# Patient Record
Sex: Female | Born: 1960
Health system: Southern US, Community
[De-identification: ages and names within clinical notes are randomized; demographics above are authoritative.]

## PROBLEM LIST (undated history)

## (undated) DIAGNOSIS — R011 Cardiac murmur, unspecified: Secondary | ICD-10-CM

## (undated) HISTORY — DX: Cardiac murmur, unspecified: R01.1

---

## 1997-04-30 ENCOUNTER — Ambulatory Visit (HOSPITAL_COMMUNITY): Admission: RE | Admit: 1997-04-30 | Discharge: 1997-04-30 | Payer: Self-pay | Admitting: Obstetrics & Gynecology

## 1997-05-02 ENCOUNTER — Ambulatory Visit (HOSPITAL_COMMUNITY): Admission: RE | Admit: 1997-05-02 | Discharge: 1997-05-02 | Payer: Self-pay | Admitting: Obstetrics & Gynecology

## 1997-05-05 ENCOUNTER — Inpatient Hospital Stay (HOSPITAL_COMMUNITY): Admission: AD | Admit: 1997-05-05 | Discharge: 1997-05-05 | Payer: Self-pay | Admitting: Obstetrics and Gynecology

## 1997-05-08 ENCOUNTER — Ambulatory Visit (HOSPITAL_COMMUNITY): Admission: RE | Admit: 1997-05-08 | Discharge: 1997-05-08 | Payer: Self-pay | Admitting: Obstetrics and Gynecology

## 1997-05-11 ENCOUNTER — Ambulatory Visit (HOSPITAL_COMMUNITY): Admission: RE | Admit: 1997-05-11 | Discharge: 1997-05-11 | Payer: Self-pay | Admitting: Obstetrics and Gynecology

## 1997-05-13 ENCOUNTER — Encounter (HOSPITAL_COMMUNITY): Admission: RE | Admit: 1997-05-13 | Discharge: 1997-08-11 | Payer: Self-pay | Admitting: Obstetrics and Gynecology

## 1998-10-23 ENCOUNTER — Other Ambulatory Visit: Admission: RE | Admit: 1998-10-23 | Discharge: 1998-10-23 | Payer: Self-pay | Admitting: Obstetrics and Gynecology

## 2010-06-17 ENCOUNTER — Other Ambulatory Visit (HOSPITAL_COMMUNITY): Payer: Self-pay | Admitting: Internal Medicine

## 2010-06-17 DIAGNOSIS — Z1231 Encounter for screening mammogram for malignant neoplasm of breast: Secondary | ICD-10-CM

## 2010-07-01 ENCOUNTER — Ambulatory Visit (HOSPITAL_COMMUNITY): Payer: Self-pay

## 2010-07-21 ENCOUNTER — Other Ambulatory Visit: Payer: Self-pay | Admitting: Obstetrics & Gynecology

## 2010-07-21 DIAGNOSIS — R928 Other abnormal and inconclusive findings on diagnostic imaging of breast: Secondary | ICD-10-CM

## 2010-07-27 ENCOUNTER — Other Ambulatory Visit: Payer: Self-pay | Admitting: Obstetrics & Gynecology

## 2010-07-27 ENCOUNTER — Ambulatory Visit
Admission: RE | Admit: 2010-07-27 | Discharge: 2010-07-27 | Disposition: A | Discharge status: Home / Self Care | Payer: Managed Care, Other (non HMO) | Source: Ambulatory Visit | Attending: Obstetrics & Gynecology | Admitting: Obstetrics & Gynecology

## 2010-07-27 DIAGNOSIS — R921 Mammographic calcification found on diagnostic imaging of breast: Secondary | ICD-10-CM

## 2010-07-27 DIAGNOSIS — R928 Other abnormal and inconclusive findings on diagnostic imaging of breast: Secondary | ICD-10-CM

## 2010-07-28 ENCOUNTER — Ambulatory Visit
Admission: RE | Admit: 2010-07-28 | Discharge: 2010-07-28 | Disposition: A | Discharge status: Home / Self Care | Payer: Managed Care, Other (non HMO) | Source: Ambulatory Visit | Attending: Obstetrics & Gynecology | Admitting: Obstetrics & Gynecology

## 2010-07-28 ENCOUNTER — Other Ambulatory Visit: Payer: Self-pay | Admitting: Radiology

## 2010-07-28 DIAGNOSIS — R921 Mammographic calcification found on diagnostic imaging of breast: Secondary | ICD-10-CM

## 2011-09-01 ENCOUNTER — Other Ambulatory Visit: Payer: Self-pay | Admitting: Obstetrics and Gynecology

## 2013-11-05 ENCOUNTER — Other Ambulatory Visit: Payer: Self-pay | Admitting: Obstetrics and Gynecology

## 2013-11-06 LAB — CYTOLOGY - PAP

## 2015-10-20 ENCOUNTER — Emergency Department (HOSPITAL_COMMUNITY)
Admission: EM | Admit: 2015-10-20 | Discharge: 2015-10-21 | Disposition: A | Discharge status: Home / Self Care | Payer: Managed Care, Other (non HMO) | Attending: Emergency Medicine | Admitting: Emergency Medicine

## 2015-10-20 ENCOUNTER — Encounter (HOSPITAL_COMMUNITY): Payer: Self-pay | Admitting: Emergency Medicine

## 2015-10-20 DIAGNOSIS — F17210 Nicotine dependence, cigarettes, uncomplicated: Secondary | ICD-10-CM | POA: Insufficient documentation

## 2015-10-20 DIAGNOSIS — R21 Rash and other nonspecific skin eruption: Secondary | ICD-10-CM | POA: Diagnosis present

## 2015-10-20 MED ORDER — LORATADINE 10 MG PO TABS
10.0000 mg | ORAL_TABLET | Freq: Every day | ORAL | Status: DC
  Administered 2015-10-20: 10 mg via ORAL
  Filled 2015-10-20: qty 1

## 2015-10-20 NOTE — ED Provider Notes (Signed)
[facility]-EMERGENCY DEPT Provider Note   CSN: [id number] Arrival date & time: 10/20/15  2149   By signing my name below, I, Nelwyn Salisbury, attest that this documentation has been prepared under the direction and in the presence of non-physician practitioner, Arvilla Meres, PA-C. Electronically Signed: Nelwyn Salisbury, Scribe. 10/20/2015. 11:33 PM.  History   Chief Complaint Chief Complaint  Patient presents with  . Insect Bite   The history is provided by the patient. No language interpreter was used.    HPI Comments:  Leslie Baldwin is a 55 y.o. female with no pertinent PMHx who presents to the Emergency Department complaining of sudden-onset constant bilateral foot itching beginning 16 hours ago. She describes her symptoms as a itching, stinging, burning sensation on her feet. Pt reports she may have been bitten by something. Pt reports that she has scratched her feet so much that she has broken the skin on her feet. She has not tried any home treatments to relieve her symptoms. Pt endorses associated rash and swelling to the area. She denies any fever, numbness, vomiting, nausea or weakness. Pt does not have a history of rashes and has not changed her soaps, lotions, or detergents recently. No new medications or  Change in medications. Pt denies being barefoot. No sick contacts.    History reviewed. No pertinent past medical history.  There are no active problems to display for this patient.   History reviewed. No pertinent surgical history.  OB History    No data available      Home Medications    Prior to Admission medications   Medication Sig Start Date End Date Taking? Authorizing Provider  loratadine (CLARITIN) 10 MG tablet Take 1 tablet (10 mg total) by mouth daily. 10/21/15   Lona Kettle, PA-C    Family History No family history on file.  Social History Social History  Substance Use Topics  . Smoking status: Current Every Day Smoker    Packs/day: 1.00    Types:  Cigarettes  . Smokeless tobacco: Never Used  . Alcohol use No     Allergies   Review of patient's allergies indicates no known allergies.   Review of Systems Review of Systems  Constitutional: Negative for fever.  Gastrointestinal: Negative for nausea and vomiting.  Skin: Positive for rash and wound.       Positive for Itching  Neurological: Negative for weakness and numbness.     Physical Exam Updated Vital Signs BP 177/97 (BP Location: Left Arm)   Pulse 74   Temp 98.3 F (36.8 C) (Oral)   Resp 18   Ht 5\' 7"  (1.702 m)   Wt 180 lb (81.6 kg)   SpO2 100%   BMI 28.19 kg/m   Physical Exam  Constitutional: She is oriented to person, place, and time. She appears well-developed and well-nourished. No distress.  HENT:  Head: Normocephalic and atraumatic.  Eyes: Conjunctivae are normal. No scleral icterus.  Neck: Normal range of motion.  Cardiovascular: Normal rate and intact distal pulses.   Pulmonary/Chest: Effort normal. No respiratory distress.  Abdominal: She exhibits no distension.  Neurological: She is alert and oriented to person, place, and time.  Skin: Skin is warm and dry. Capillary refill takes less than 2 seconds. Rash noted. She is not diaphoretic. There is erythema.  Multiple excoriations on feet b/l with surrounding erythema and swelling; one excoriation on left foot actively weeping serous fluid; ROM, strength, sensation intact. 2+ DP pulses. Capillary refill <3 seconds.   Psychiatric:  She has a normal mood and affect. Her behavior is normal.  Nursing note and vitals reviewed.  ED Treatments / Results  DIAGNOSTIC STUDIES:  Oxygen Saturation is 100% on RA, normal by my interpretation.    COORDINATION OF CARE:  11:34 PM Discussed treatment plan with pt at bedside which included OTC allergy medications and pain killers and pt agreed to plan.  Procedures Procedures (including critical care time)  Medications Ordered in ED Medications - No data to  display   Initial Impression / Assessment and Plan / ED Course  I have reviewed the triage vital signs and the nursing notes.  Pertinent labs & imaging results that were available during my care of the patient were reviewed by me and considered in my medical decision making (see chart for details).  Clinical Course  Value Comment By Time  DG Foot Complete Left No obvious fracture or dislocation.  Lona Kettleshley Laurel Laiyla Slagel, [place]-C 09/20 0040    Patient presents to ED with complaint of rash. Patient is afebrile and non-toxic appearing in NAD. Vital signs remarkable for elevated blood, pressure otherwise stable. Physical exam remarkable for multiple excoriations on feet b/l with surrounding erythema and localized swelling. ROM, strength, sensation, and pulses intact. X-ray negative. Pt is afebrile and non-toxic appearing. ?non-specific eruption. No signs of infection. Excoriations cleaned and ABX ointment applied. Symptomatic management discussed to include elevating legs, icing, and PO antihistamine. Discussed wound care for excoriations. Follow up with PCP in 2-3 days if sxs persist for referral to dermatologist. Return precautions given. Pt voiced understanding and is agreeable.    Final Clinical Impressions(s) / ED Diagnoses   Final diagnoses:  Rash    New Prescriptions Discharge Medication List as of 10/21/2015  1:31 AM    START taking these medications   Details  loratadine (CLARITIN) 10 MG tablet Take 1 tablet (10 mg total) by mouth daily., Starting Wed 10/21/2015, Print      I personally performed the services described in this documentation, which was scribed in my presence. The recorded information has been reviewed and is accurate.     Lona Kettleshley Laurel Lance Galas, [place]-C 10/22/15 2307    Alvira MondayErin Schlossman, MD 10/25/15 2133

## 2015-10-20 NOTE — ED Triage Notes (Signed)
Pt reports multiple insect bite to bilateral feet, states noticed this morning. Swelling and painful per patient.

## 2015-10-21 ENCOUNTER — Emergency Department (HOSPITAL_COMMUNITY): Payer: Managed Care, Other (non HMO)

## 2015-10-21 MED ORDER — LORATADINE 10 MG PO TABS: 10.0000 mg | ORAL_TABLET | Freq: Every day | ORAL | 0 refills | Status: DC

## 2015-10-21 NOTE — Discharge Instructions (Addendum)
Read the information below.  Your imaging was re-assuring.  For the next 24 hours ice for 20 minute increments and elevate your leg.  Be sure to keep open wounds clean and dry. You can wash with warm soapy water and apply antibiotic ointment.  Take loratadine to help avoid itching.  Use the prescribed medication as directed.  Please discuss all new medications with your pharmacist.   Follow up with your primary provider in 2-3 days for re-evaluation.  You may return to the Emergency Department at any time for worsening condition or any new symptoms that concern you. Return to ED if develop fever, purulent drainage, worsening swelling, red streaking up your leg, numbness, weakness.

## 2017-07-26 DIAGNOSIS — K219 Gastro-esophageal reflux disease without esophagitis: Secondary | ICD-10-CM | POA: Diagnosis not present

## 2017-07-26 DIAGNOSIS — R1084 Generalized abdominal pain: Secondary | ICD-10-CM | POA: Diagnosis not present

## 2017-07-26 DIAGNOSIS — M545 Low back pain: Secondary | ICD-10-CM | POA: Diagnosis not present

## 2017-07-26 DIAGNOSIS — S335XXA Sprain of ligaments of lumbar spine, initial encounter: Secondary | ICD-10-CM | POA: Diagnosis not present

## 2017-09-06 ENCOUNTER — Encounter (HOSPITAL_COMMUNITY): Payer: Self-pay

## 2017-09-06 ENCOUNTER — Other Ambulatory Visit: Payer: Self-pay

## 2017-09-06 ENCOUNTER — Ambulatory Visit (HOSPITAL_COMMUNITY)
Admission: EM | Admit: 2017-09-06 | Discharge: 2017-09-06 | Disposition: A | Discharge status: Home / Self Care | Payer: Worker's Compensation | Attending: Family Medicine | Admitting: Family Medicine

## 2017-09-06 DIAGNOSIS — M25512 Pain in left shoulder: Secondary | ICD-10-CM | POA: Diagnosis not present

## 2017-09-06 DIAGNOSIS — I10 Essential (primary) hypertension: Secondary | ICD-10-CM | POA: Diagnosis not present

## 2017-09-06 DIAGNOSIS — M25562 Pain in left knee: Secondary | ICD-10-CM

## 2017-09-06 DIAGNOSIS — K5900 Constipation, unspecified: Secondary | ICD-10-CM | POA: Diagnosis not present

## 2017-09-06 DIAGNOSIS — K219 Gastro-esophageal reflux disease without esophagitis: Secondary | ICD-10-CM | POA: Diagnosis not present

## 2017-09-06 MED ORDER — NAPROXEN 500 MG PO TABS: 500.0000 mg | ORAL_TABLET | Freq: Two times a day (BID) | ORAL | 0 refills | Status: DC

## 2017-09-06 MED ORDER — CYCLOBENZAPRINE HCL 10 MG PO TABS: 10.0000 mg | ORAL_TABLET | Freq: Every day | ORAL | 0 refills | Status: DC
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
## 2017-09-06 NOTE — Discharge Instructions (Addendum)
Call tomorrow morning schedule follow up appointment. See information sheet given.  Be aware, muscle relaxer medications may cause drowsiness. Please do not drive, operate heavy machinery or make important decisions while on this medication, it can cloud your judgement.
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
## 2017-11-02 DIAGNOSIS — Z1231 Encounter for screening mammogram for malignant neoplasm of breast: Secondary | ICD-10-CM | POA: Diagnosis not present

## 2017-11-02 DIAGNOSIS — Z6829 Body mass index (BMI) 29.0-29.9, adult: Secondary | ICD-10-CM | POA: Diagnosis not present

## 2017-11-02 DIAGNOSIS — Z01419 Encounter for gynecological examination (general) (routine) without abnormal findings: Secondary | ICD-10-CM | POA: Diagnosis not present

## 2017-11-16 DIAGNOSIS — Z13820 Encounter for screening for osteoporosis: Secondary | ICD-10-CM | POA: Diagnosis not present

## 2019-03-04 ENCOUNTER — Encounter (HOSPITAL_COMMUNITY): Payer: Self-pay | Admitting: Emergency Medicine

## 2019-03-04 ENCOUNTER — Other Ambulatory Visit: Payer: Self-pay

## 2019-03-04 ENCOUNTER — Emergency Department (HOSPITAL_COMMUNITY)
Admission: EM | Admit: 2019-03-04 | Discharge: 2019-03-05 | Disposition: A | Discharge status: Home / Self Care | Payer: Worker's Compensation | Attending: Emergency Medicine | Admitting: Emergency Medicine

## 2019-03-04 DIAGNOSIS — F17210 Nicotine dependence, cigarettes, uncomplicated: Secondary | ICD-10-CM | POA: Diagnosis not present

## 2019-03-04 DIAGNOSIS — Z041 Encounter for examination and observation following transport accident: Secondary | ICD-10-CM | POA: Diagnosis not present

## 2019-03-04 DIAGNOSIS — Z0283 Encounter for blood-alcohol and blood-drug test: Secondary | ICD-10-CM | POA: Diagnosis not present

## 2019-03-04 DIAGNOSIS — Z026 Encounter for examination for insurance purposes: Secondary | ICD-10-CM | POA: Diagnosis not present

## 2019-03-04 DIAGNOSIS — Y990 Civilian activity done for income or pay: Secondary | ICD-10-CM | POA: Insufficient documentation

## 2019-03-04 DIAGNOSIS — Z79899 Other long term (current) drug therapy: Secondary | ICD-10-CM | POA: Insufficient documentation
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
## 2019-03-05 NOTE — Discharge Instructions (Signed)
Thank you for allowing me to care for you today in the Emergency Department.   Follow-up with your occupational health team as needed since we are unable to complete the breathalyzer blood alcohol level in the ER today.  Return to the emergency department if you develop chest pain, shortness of breath, new numbness or weakness, if you pass out, or develop other new, concerning symptoms.
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
## 2019-04-15 ENCOUNTER — Ambulatory Visit: Payer: Managed Care, Other (non HMO) | Attending: Internal Medicine

## 2019-04-15 DIAGNOSIS — Z23 Encounter for immunization: Secondary | ICD-10-CM

## 2019-04-15 NOTE — Progress Notes (Signed)
   Covid-19 Vaccination Clinic  Name:  BELLATRIX DEVONSHIRE    MRN: [id number] DOB: Jun 21, 1960  04/15/2019  Ms. Babich was observed post Covid-19 immunization for 15 minutes without incident. She was provided with Vaccine Information Sheet and instruction to access the V-Safe system.   Ms. Bobrowski was instructed to call 911 with any severe reactions post vaccine: Marland Kitchen Difficulty breathing  . Swelling of face and throat  . A fast heartbeat  . A bad rash all over body  . Dizziness and weakness   Immunizations Administered    Name Date Dose VIS Date Route   Pfizer COVID-19 Vaccine 04/15/2019 12:37 PM 0.3 mL 01/11/2019 Intramuscular   Manufacturer: [employer], [employer]   Lot: [id number]   NDC: [address]-8

## 2019-05-08 ENCOUNTER — Ambulatory Visit: Payer: Managed Care, Other (non HMO) | Attending: Internal Medicine

## 2019-05-08 DIAGNOSIS — Z23 Encounter for immunization: Secondary | ICD-10-CM

## 2019-05-08 NOTE — Progress Notes (Signed)
   Covid-19 Vaccination Clinic  Name:  Leslie Baldwin    MRN: [id number] DOB: January 21, 1961  05/08/2019  Leslie Baldwin was observed post Covid-19 immunization for 15 minutes without incident. She was provided with Vaccine Information Sheet and instruction to access the V-Safe system.   Leslie Baldwin was instructed to call 911 with any severe reactions post vaccine: Marland Kitchen Difficulty breathing  . Swelling of face and throat  . A fast heartbeat  . A bad rash all over body  . Dizziness and weakness   Immunizations Administered    Name Date Dose VIS Date Route   Pfizer COVID-19 Vaccine 05/08/2019  1:12 PM 0.3 mL 01/11/2019 Intramuscular   Manufacturer: [employer], [employer]   Lot: [id number]   NDC: [address]-7

## 2019-05-13 ENCOUNTER — Other Ambulatory Visit: Payer: Self-pay

## 2019-05-13 ENCOUNTER — Ambulatory Visit (INDEPENDENT_AMBULATORY_CARE_PROVIDER_SITE_OTHER): Payer: BC Managed Care – PPO | Admitting: Family Medicine

## 2019-05-13 VITALS — BP 112/60 | HR 107 | Ht 67.0 in | Wt 180.0 lb

## 2019-05-13 DIAGNOSIS — Z1211 Encounter for screening for malignant neoplasm of colon: Secondary | ICD-10-CM

## 2019-05-13 DIAGNOSIS — Z0130 Encounter for examination of blood pressure without abnormal findings: Secondary | ICD-10-CM | POA: Insufficient documentation

## 2019-05-13 DIAGNOSIS — Z113 Encounter for screening for infections with a predominantly sexual mode of transmission: Secondary | ICD-10-CM | POA: Diagnosis not present

## 2019-05-13 DIAGNOSIS — R5383 Other fatigue: Secondary | ICD-10-CM

## 2019-05-13 DIAGNOSIS — R4589 Other symptoms and signs involving emotional state: Secondary | ICD-10-CM | POA: Insufficient documentation

## 2019-05-13 DIAGNOSIS — Z122 Encounter for screening for malignant neoplasm of respiratory organs: Secondary | ICD-10-CM | POA: Diagnosis not present

## 2019-05-13 NOTE — Assessment & Plan Note (Signed)
Her blood pressure remains perfectly normal in clinic today.  Chart review does show that she has a history of elevated blood pressure.  We will continue to monitor for now and consider treatment when she shows evidence of hypertension.

## 2019-05-13 NOTE — Assessment & Plan Note (Signed)
-  Follow-up low-dose CT chest 

## 2019-05-13 NOTE — Assessment & Plan Note (Signed)
Very likely related to the above-noted mood symptoms.  Her medical history is also remarkable for anemia.  We will draw labs today for initial work-up and assess for anemia, hypothyroid, electrolyte abnormalities.  If these are normal, we will continue to encourage her to consider treatment for her depressed mood. -Follow-up TSH, CBC, BMP

## 2019-05-13 NOTE — Patient Instructions (Signed)
It was nice to meet you today. There is a quick summary of things were doing for you today:  Cancer screening: We will schedule an appointment for you to have an image done of your lungs to make sure you do not have any early stages of lung cancer. We will make sure that we can get information from your previous colonoscopy. Please come back in the next month or 2 for [place] to do a Pap smear as well.  Fatigue: I am not sure what is causing her fatigue. We will get some blood work today and see if there is any evidence of you having low blood levels or electrolyte abnormalities. Sometimes this can be a hormonal issue and we will look at that to.  I will let you know if you have any abnormalities in your lab work.

## 2019-05-13 NOTE — Progress Notes (Addendum)
    SUBJECTIVE:   CHIEF COMPLAINT / HPI:   Ms. Toelle presents to clinic today to establish care.  Her medical and surgical history was reviewed and filled out through the history tab.  Blood pressure Ms. Knick reports that she has a history of hypertension.  Her blood pressure remains normal in clinic today of the she reports that it is often the case that her blood pressure is normal in clinic that she is certain that it is high when she is at home.  She typically notices this from a sinus headache and the general knowledge that her blood pressure is high.  Mood disorder Ms. Quigg reports that she is experiencing some symptoms of depression.  Notably feeling down or depressed and having very low energy.  She has been seen for this before and is not interested in medication or talk therapy at this time.  Fatigue She denies feeling tired although she endorses very low energy at all times.  She has not been having significant difficulty sleeping she is feels that she has less energy than she would like when going about her day.  This has been going on for some months.  She has not noted any hair thinning or constipation.  She notes that she has previously had an issue with low blood levels.  PERTINENT  PMH / PSH: see above and history tab  OBJECTIVE:   BP 112/60   Pulse (!) 107   Ht 5\' 7"  (1.702 m)   Wt 180 lb (81.6 kg)   SpO2 96%   BMI 28.19 kg/m   General: Alert and cooperative and appears to be in no acute distress HEENT: Neck non-tender without lymphadenopathy, masses or thyromegaly Cardio: Normal S1 and S2, no S3 or S4. Rhythm is regular. No murmurs or rubs.   Pulm: Clear to auscultation bilaterally, no crackles, wheezing, or diminished breath sounds. Normal respiratory effort Abdomen: Bowel sounds normal. Abdomen soft and non-tender.  Extremities: No peripheral edema. Warm/ well perfused.  Strong radial pulse. Neuro: Cranial nerves grossly intact   ASSESSMENT/PLAN:   Blood  pressure check Her blood pressure remains perfectly normal in clinic today.  Chart review does show that she has a history of elevated blood pressure.  We will continue to monitor for now and consider treatment when she shows evidence of hypertension.  Fatigue Very likely related to the above-noted mood symptoms.  Her medical history is also remarkable for anemia.  We will draw labs today for initial work-up and assess for anemia, hypothyroid, electrolyte abnormalities.  If these are normal, we will continue to encourage her to consider treatment for her depressed mood. -Follow-up TSH, CBC, BMP  Depressed mood PHQ-9 score of 9 today.  No SI/HI.  The symptoms have been persistent for months.  She is not interested in pharmacotherapy or talk therapy at this time. -Continue to monitor -Continue to offer medication and/or therapy  Encounter for screening for lung cancer -Follow-up low-dose CT chest   Health maintenance -Follow-up for mammography in several months (she was recently vaccinated against Covid) -Schedule follow-up visit for Pap smear -She reports that she has had a colonoscopy in the last several years and filled out a release of information to obtain those results. -Follow-up HIV, hep C  , MD [facility]

## 2019-05-13 NOTE — Assessment & Plan Note (Signed)
PHQ-9 score of 9 today.  No SI/HI.  The symptoms have been persistent for months.  She is not interested in pharmacotherapy or talk therapy at this time. -Continue to monitor -Continue to offer medication and/or therapy

## 2019-05-14 ENCOUNTER — Encounter: Payer: Self-pay | Admitting: Family Medicine

## 2019-05-14 LAB — CBC
Hematocrit: 43.5 % (ref 34.0–46.6)
Hemoglobin: 14.5 g/dL (ref 11.1–15.9)
MCH: 27.8 pg (ref 26.6–33.0)
MCHC: 33.3 g/dL (ref 31.5–35.7)
MCV: 84 fL (ref 79–97)
Platelets: 330 10*3/uL (ref 150–450)
RBC: 5.21 x10E6/uL (ref 3.77–5.28)
RDW: 13.7 % (ref 11.7–15.4)
WBC: 8.9 10*3/uL (ref 3.4–10.8)

## 2019-05-14 LAB — HIV ANTIBODY (ROUTINE TESTING W REFLEX): HIV Screen 4th Generation wRfx: NONREACTIVE

## 2019-05-14 LAB — BASIC METABOLIC PANEL
BUN/Creatinine Ratio: 18 (ref 9–23)
BUN: 16 mg/dL (ref 6–24)
CO2: 22 mmol/L (ref 20–29)
Calcium: 9.8 mg/dL (ref 8.7–10.2)
Chloride: 98 mmol/L (ref 96–106)
Creatinine, Ser: 0.88 mg/dL (ref 0.57–1.00)
GFR calc Af Amer: 85 mL/min/{1.73_m2} (ref >59)
GFR calc non Af Amer: 74 mL/min/{1.73_m2} (ref >59)
Glucose: 80 mg/dL (ref 65–99)
Potassium: 3.9 mmol/L (ref 3.5–5.2)
Sodium: 135 mmol/L (ref 134–144)

## 2019-05-14 LAB — HEPATITIS C ANTIBODY: Hep C Virus Ab: <0.1 s/co ratio (ref 0.0–0.9)

## 2019-05-14 LAB — TSH: TSH: 0.469 u[IU]/mL (ref 0.450–4.500)

## 2019-05-29 ENCOUNTER — Ambulatory Visit: Payer: Managed Care, Other (non HMO)

## 2019-06-05 ENCOUNTER — Encounter: Payer: Self-pay | Admitting: Family Medicine

## 2019-06-05 ENCOUNTER — Other Ambulatory Visit: Payer: Self-pay

## 2019-06-05 ENCOUNTER — Ambulatory Visit (INDEPENDENT_AMBULATORY_CARE_PROVIDER_SITE_OTHER): Payer: BC Managed Care – PPO | Admitting: Family Medicine

## 2019-06-05 ENCOUNTER — Other Ambulatory Visit (HOSPITAL_COMMUNITY)
Admission: RE | Admit: 2019-06-05 | Discharge: 2019-06-05 | Disposition: A | Discharge status: Home / Self Care | Payer: BC Managed Care – PPO | Source: Ambulatory Visit | Attending: Family Medicine | Admitting: Family Medicine

## 2019-06-05 VITALS — HR 111 | Ht 67.0 in | Wt 176.6 lb

## 2019-06-05 DIAGNOSIS — N941 Unspecified dyspareunia: Secondary | ICD-10-CM

## 2019-06-05 DIAGNOSIS — Z124 Encounter for screening for malignant neoplasm of cervix: Secondary | ICD-10-CM

## 2019-06-05 DIAGNOSIS — R1084 Generalized abdominal pain: Secondary | ICD-10-CM | POA: Diagnosis not present

## 2019-06-05 DIAGNOSIS — F439 Reaction to severe stress, unspecified: Secondary | ICD-10-CM

## 2019-06-05 NOTE — Patient Instructions (Signed)
Your blood work was all normal. I will send you a note about your pap smear. I will send a referral in for a GI (stomach) doctor. You should hear from them in the next 2 weeks. Great to meet you!

## 2019-06-06 DIAGNOSIS — Z124 Encounter for screening for malignant neoplasm of cervix: Secondary | ICD-10-CM | POA: Insufficient documentation

## 2019-06-06 DIAGNOSIS — F439 Reaction to severe stress, unspecified: Secondary | ICD-10-CM | POA: Insufficient documentation

## 2019-06-06 DIAGNOSIS — R1084 Generalized abdominal pain: Secondary | ICD-10-CM | POA: Insufficient documentation

## 2019-06-06 DIAGNOSIS — N941 Unspecified dyspareunia: Secondary | ICD-10-CM | POA: Insufficient documentation

## 2019-06-06 NOTE — Assessment & Plan Note (Signed)
Ithink this is responsible for her depressed mood and lack of"energy"and we discussed. Does not want further current treatment or intervention but appreciated discussion. Reviewed the normality of her exam and her recent blood work

## 2019-06-06 NOTE — Assessment & Plan Note (Signed)
Sounds like irritable bowel Had coloniscopy years ago (outside [facility]) and had 2 polyps but no further deetails. Bloating and cramping main issues I think she would dbenefit from GI eval and will set up referral

## 2019-06-06 NOTE — Progress Notes (Signed)
    CHIEF COMPLAINT / HPI:  GYN exam Had been told by previous provider her "insides were on  The outside". Thinks she had an abnormal pap at one time. Postmenopausal 2. Ds pareunia: long standing. Not currently sexually active and has some questions. 3. Questions about blood work. She has less "energy" over last few years. 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 Lots of stress. Not where she wants to be un her life.   PERTINENT  PMH / PSH: I have reviewed the patient's medications, allergies, past medical and surgical history, smoking status and updated in the EMR as appropriate.   OBJECTIVE:  Pulse (!) 111   Ht 5\' 7"  (1.702 m)   Wt 176 lb 9.6 oz (80.1 kg)   SpO2 99%   BMI 27.66 kg/m   Vital signs reviewed GENERALl: Well developed, well nourished, in no acute distress. HEENT: PERRLA, EOMI, sclerae are nonicteric NECK: Supple, FROM, without lymphadenopathy.  THYROID: normal without nodularity CAROTID ARTERIES: without bruits LUNGS: clear to auscultation bilaterally. No wheezes or rales. Normal respiratory effort HEART: Regular rate and rhythm, no murmurs. Distal pulses are bilaterally symmetrical, 2+. ABDOMEN: soft with positive bowel sounds. No masses noted MSK: MOE x 4. Normal muscle strength, bulk and tone. SKIN no rash. Normal temperature. NEURO: no focal deficits. Normal gait. Normal balance. GU extenally normal with mild age appropriate changes. No adnexal masses or tenderness. No cystocele. Normal bimanual exam. Pap obtained. Nonparous cervix. PSYCH: AxOx4. Good eye contact.. No psychomotor retardation or agitation. Appropriate speech fluency and content. Asks and answers questions appropriately. Mood is congruent.  ASSESSMENT / PLAN:   Screening for cervical cancer Reviewed paps in sytem. The ne most recent is not available in our chart. One ASCUS. Pap and  HOV testing today (cotesting with reflex genotyping)  Dyspareunia, female Discussed at length Has "always" had some discomfort with intercourse but not really interested in further evaluation.  Stress at home Ithink this is responsible for her depressed mood and lack of"energy"and we discussed. Does not want further current treatment or intervention but appreciated discussion. Reviewed the normality of her exam and her recent blood work  Abdominal discomfort, generalized Sounds like irritable bowel Had coloniscopy years ago (outside [facility]) and had 2 polyps but no further deetails. Bloating and cramping main issues I think she would dbenefit from GI eval and will set up referral   MD

## 2019-06-06 NOTE — Assessment & Plan Note (Signed)
Reviewed paps in sytem. The ne most recent is not available in our chart. One ASCUS. Pap and HOV testing today (cotesting with reflex genotyping)

## 2019-06-06 NOTE — Assessment & Plan Note (Signed)
Discussed at length Has "always" had some discomfort with intercourse but not really interested in further evaluation.

## 2019-06-07 LAB — CYTOLOGY - PAP
Adequacy: ABSENT
Comment: NEGATIVE
Diagnosis: NEGATIVE
High risk HPV: NEGATIVE

## 2019-06-11 ENCOUNTER — Encounter: Payer: Self-pay | Admitting: Family Medicine

## 2019-06-11 NOTE — Progress Notes (Signed)
NILM but absent TZ. Will recommend repeat one year.

## 2019-06-20 ENCOUNTER — Encounter: Payer: Self-pay | Admitting: Nurse Practitioner

## 2019-07-12 ENCOUNTER — Encounter: Payer: Self-pay | Admitting: Nurse Practitioner

## 2019-07-12 ENCOUNTER — Ambulatory Visit (INDEPENDENT_AMBULATORY_CARE_PROVIDER_SITE_OTHER): Payer: BC Managed Care – PPO | Admitting: Nurse Practitioner

## 2019-07-12 VITALS — BP 124/78 | HR 99 | Ht 67.0 in | Wt 185.0 lb

## 2019-07-12 DIAGNOSIS — R1013 Epigastric pain: Secondary | ICD-10-CM

## 2019-07-12 DIAGNOSIS — R1010 Upper abdominal pain, unspecified: Secondary | ICD-10-CM | POA: Insufficient documentation

## 2019-07-12 NOTE — Progress Notes (Addendum)
07/12/2019 PATRICK SOHM [id number] 11-Dec-1960   CHIEF COMPLAINT: Abdominal pain   HISTORY OF PRESENT ILLNESS:  Virl Cagey. Erker is a 59 year old female with a past medical history of  depression, hypertension, heart murmur "since birth" and colon polyps. No surgical history. She presents to our office today as referred by Dr. Denny Levy  for further evaluation for epigastric pain which radiates to her back which is triggered if she eats greasy foods which has occurred 3 to 4 times over the past year. The last episode occurred 4 months ago. No associated nausea or vomiting. When she has the epigastric pain which radiates  to her back she goes to bed for a few hours. When she awakens, the pain is gone. No known history of gallstones or pancreatitis.  No obviously heartburn. No dysphagia. She has increased belching for the past year. Some abdominal bloat. She is passing soft stools since having a colonoscopy 1 or 2 years ago at [facility] in [facility]. She stated 1 or 2 colon polyps were removed and she was advised to repeat a colonoscopy in 5 years. No diarrhea. No rectal bleeding or black stools. No weight loss. No fever or chills. She has sweats at night once weekly or less which started with menopause a few year years ago. She does not take any medications. Infrequent NSAID use.  No other complaints today.  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 No alcohol use. No drug use.   Family History: Mother died age 41 aneurysm.  Father age 55 diabetes. 4 brothers and 1 sister with hypertension. Half aunt breast cancer.    No outpatient encounter medications on file as of 07/12/2019.   No facility-administered encounter medications on file as of 07/12/2019.    REVIEW OF SYSTEMS: All other systems reviewed and negative except where noted in the History of Present Illness.  PHYSICAL EXAM: BP 124/78    Pulse 99    Ht 5\' 7"  (1.702 m)    Wt 185 lb (83.9 kg)    BMI 28.98 kg/m  General: Well developed  59 year old female in no acute distress. Head: Normocephalic and atraumatic. Eyes:  Sclerae non-icteric, conjunctive pink. Ears: Normal auditory acuity. Mouth: Dentition intact. No ulcers or lesions.  Neck: Supple, no lymphadenopathy. Thyroid ? fullness.   Lungs: Clear bilaterally to auscultation without wheezes, crackles or rhonchi. Heart: Regular rate and rhythm. No murmur, rub or gallop appreciated.  Abdomen: Soft, nontender, non distended. No masses. No hepatosplenomegaly. Normoactive bowel sounds x 4 quadrants.  Rectal: Deferred.  Musculoskeletal: Symmetrical with no gross deformities. Skin: Warm and dry. No rash or lesions on visible extremities. Extremities: No edema. Neurological: Alert oriented x 4, no focal deficits.  Psychological:  Alert and cooperative. Normal mood and affect.  ASSESSMENT AND PLAN:  20.  59 year old female with episodic  epigastric pain which radiates straight through her back associated with eating fatty/greasy foods, no episodes of epigastric pain for the past 4 months. -Abdominal sonogram to rule out gallstones -Patient to call our office if she develops another episode of upper abdominal pain which radiates straight through the back, laboratory studies will be ordered at time of next attack  to include a CBC, CMET lipase and CRP and further abdominal imaging to be determined.  -EGD if upper abdominal pain recurs  -Follow-up as needed  2.  History of colon polyp -Colonoscopy procedure  and biopsy reports from [facility] in [facility] requested -Colonoscopy recall date to be further verified once the above records received and reviewed  3.  Smoker -Smoking cessation recommended  ADDENDUM 07/17/2019: RECORDS RECEIVED.  Colonoscopy  02/12/2016 identified a 43mm tubular adenomatous polyp to the ascending colon and a 80mm hyperplastic polyp at 20 cm which were removed. A repeat colonoscopy in 5 years due 01/2021 was recommended.     CC:  Dickie La, MD

## 2019-07-12 NOTE — Patient Instructions (Addendum)
If you are age 57 or older, your body mass index should be between 23-30. Your Body mass index is 45.24 kg/m. If this is out of the aforementioned range listed, please consider follow up with your Primary Care Provider.  If you are age 22 or younger, your body mass index should be between 19-25. Your Body mass index is 45.24 kg/m. If this is out of the aformentioned range listed, please consider follow up with your Primary Care Provider.   You have been scheduled for an abdominal ultrasound at [facility] (1st floor of hospital) on 07/24/2019 at 10:30am. Please arrive 15 minutes prior to your appointment for registration. Make certain not to have anything to eat or drink 6 hours prior to your appointment. Should you need to reschedule your appointment, please contact radiology at [phone]. This test typically takes about 30 minutes to perform.  Avoid fatty/greasy foods Call our office if your abdominal pain reoccurs.  Due to recent changes in healthcare laws, you may see the results of your imaging and laboratory studies on MyChart before your provider has had a chance to review them.  We understand that in some cases there may be results that are confusing or concerning to you. Not all laboratory results come back in the same time frame and the provider may be waiting for multiple results in order to interpret others.  Please give [place] 48 hours in order for your provider to thoroughly review all the results before contacting the office for clarification of your results.   Thank you for choosing [facility] Arnaldo Natal, CRNP

## 2019-07-15 NOTE — Progress Notes (Signed)
Addendum: Reviewed and agree with assessment and management plan. Mylena Sedberry M, MD  

## 2019-07-17 ENCOUNTER — Telehealth: Payer: Self-pay | Admitting: Nurse Practitioner

## 2019-07-17 NOTE — Telephone Encounter (Signed)
Please enter colonoscopy recall date Jan. 2023. Prior colonoscopy records received. Addendum added to last office visit note.

## 2019-07-24 ENCOUNTER — Other Ambulatory Visit: Payer: Self-pay

## 2019-07-24 ENCOUNTER — Ambulatory Visit (HOSPITAL_COMMUNITY)
Admission: RE | Admit: 2019-07-24 | Discharge: 2019-07-24 | Disposition: A | Discharge status: Home / Self Care | Payer: BC Managed Care – PPO | Source: Ambulatory Visit | Attending: Nurse Practitioner | Admitting: Nurse Practitioner

## 2019-07-24 DIAGNOSIS — R1013 Epigastric pain: Secondary | ICD-10-CM | POA: Diagnosis not present

## 2019-07-24 DIAGNOSIS — R1011 Right upper quadrant pain: Secondary | ICD-10-CM | POA: Diagnosis not present

## 2019-07-26 ENCOUNTER — Telehealth: Payer: Self-pay | Admitting: General Surgery

## 2019-07-26 NOTE — Telephone Encounter (Signed)
Left a voicemail that imaging was normal

## 2019-07-26 NOTE — Telephone Encounter (Signed)
-----   Message from Arnaldo Natal, NP sent at 07/25/2019  8:42 PM EDT ----- French Ana, pls inform the patient his abdominal sonogram was normal. Thx

## 2020-01-29 ENCOUNTER — Ambulatory Visit (HOSPITAL_COMMUNITY): Payer: Self-pay

## 2020-01-29 ENCOUNTER — Other Ambulatory Visit: Payer: Self-pay

## 2020-01-29 ENCOUNTER — Ambulatory Visit (HOSPITAL_COMMUNITY)
Admission: EM | Admit: 2020-01-29 | Discharge: 2020-01-29 | Disposition: A | Discharge status: Home / Self Care | Payer: Self-pay | Attending: Urgent Care | Admitting: Urgent Care

## 2020-01-29 DIAGNOSIS — R109 Unspecified abdominal pain: Secondary | ICD-10-CM

## 2020-01-29 DIAGNOSIS — M25561 Pain in right knee: Secondary | ICD-10-CM

## 2020-01-29 MED ORDER — TIZANIDINE HCL 4 MG PO TABS: 4.0000 mg | ORAL_TABLET | Freq: Every day | ORAL | 0 refills | Status: AC

## 2020-01-29 MED ORDER — NAPROXEN 500 MG PO TABS
500.0000 mg | ORAL_TABLET | Freq: Two times a day (BID) | ORAL | 0 refills | Status: DC
Start: 2020-01-29 — End: 2020-08-02
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
## 2020-01-29 NOTE — Discharge Instructions (Signed)
If you experience worsening abdominal pain, bleeding in the urine or toilet, bloating of the belly, bruising of the belly or shortness of breath, then present to the ER for a CT scan of your abdomen.
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
## 2020-03-11 ENCOUNTER — Ambulatory Visit: Payer: BC Managed Care – PPO | Attending: Internal Medicine

## 2020-03-11 ENCOUNTER — Other Ambulatory Visit (HOSPITAL_COMMUNITY): Payer: Self-pay | Admitting: Internal Medicine

## 2020-03-11 DIAGNOSIS — Z23 Encounter for immunization: Secondary | ICD-10-CM

## 2020-03-11 NOTE — Progress Notes (Signed)
   Covid-19 Vaccination Clinic  Name:  Leslie Baldwin    MRN: [id number] DOB: 1960/04/24  03/11/2020  Leslie Baldwin was observed post Covid-19 immunization for 15 minutes without incident. She was provided with Vaccine Information Sheet and instruction to access the V-Safe system.   Leslie Baldwin was instructed to call 911 with any severe reactions post vaccine: Marland Kitchen Difficulty breathing  . Swelling of face and throat  . A fast heartbeat  . A bad rash all over body  . Dizziness and weakness   Immunizations Administered    Name Date Dose VIS Date Route   Moderna Covid-19 Booster Vaccine 03/11/2020 11:55 AM 0.25 mL 11/20/2019 Intramuscular   Manufacturer: Moderna   Lot: [id number]   NDC: 55208-022-33

## 2020-08-02 ENCOUNTER — Other Ambulatory Visit: Payer: Self-pay

## 2020-08-02 ENCOUNTER — Emergency Department (HOSPITAL_BASED_OUTPATIENT_CLINIC_OR_DEPARTMENT_OTHER)
Admission: EM | Admit: 2020-08-02 | Discharge: 2020-08-02 | Disposition: A | Discharge status: Home / Self Care | Payer: BC Managed Care – PPO | Attending: Emergency Medicine | Admitting: Emergency Medicine

## 2020-08-02 ENCOUNTER — Emergency Department (HOSPITAL_BASED_OUTPATIENT_CLINIC_OR_DEPARTMENT_OTHER): Payer: BC Managed Care – PPO

## 2020-08-02 ENCOUNTER — Encounter (HOSPITAL_BASED_OUTPATIENT_CLINIC_OR_DEPARTMENT_OTHER): Payer: Self-pay | Admitting: Emergency Medicine

## 2020-08-02 DIAGNOSIS — F17210 Nicotine dependence, cigarettes, uncomplicated: Secondary | ICD-10-CM | POA: Diagnosis not present

## 2020-08-02 DIAGNOSIS — R0789 Other chest pain: Secondary | ICD-10-CM | POA: Insufficient documentation

## 2020-08-02 DIAGNOSIS — R079 Chest pain, unspecified: Secondary | ICD-10-CM | POA: Diagnosis not present

## 2020-08-02 LAB — CBC WITH DIFFERENTIAL/PLATELET
Abs Immature Granulocytes: 0.01 10*3/uL (ref 0.00–0.07)
Basophils Absolute: 0.1 10*3/uL (ref 0.0–0.1)
Basophils Relative: 1 %
Eosinophils Absolute: 0.3 10*3/uL (ref 0.0–0.5)
Eosinophils Relative: 5 %
HCT: 42.6 % (ref 36.0–46.0)
Hemoglobin: 14.1 g/dL (ref 12.0–15.0)
Immature Granulocytes: 0 %
Lymphocytes Relative: 33 %
Lymphs Abs: 2.2 10*3/uL (ref 0.7–4.0)
MCH: 28.4 pg (ref 26.0–34.0)
MCHC: 33.1 g/dL (ref 30.0–36.0)
MCV: 85.9 fL (ref 80.0–100.0)
Monocytes Absolute: 0.5 10*3/uL (ref 0.1–1.0)
Monocytes Relative: 8 %
Neutro Abs: 3.6 10*3/uL (ref 1.7–7.7)
Neutrophils Relative %: 53 %
Platelets: 298 10*3/uL (ref 150–400)
RBC: 4.96 MIL/uL (ref 3.87–5.11)
RDW: 13.4 % (ref 11.5–15.5)
WBC: 6.6 10*3/uL (ref 4.0–10.5)
nRBC: 0 % (ref 0.0–0.2)

## 2020-08-02 LAB — BASIC METABOLIC PANEL
Anion gap: 6 (ref 5–15)
BUN: 11 mg/dL (ref 6–20)
CO2: 26 mmol/L (ref 22–32)
Calcium: 8.6 mg/dL — ABNORMAL LOW (ref 8.9–10.3)
Chloride: 105 mmol/L (ref 98–111)
Creatinine, Ser: 0.8 mg/dL (ref 0.44–1.00)
GFR, Estimated: >60 mL/min (ref >60)
Glucose, Bld: 94 mg/dL (ref 70–99)
Potassium: 3.9 mmol/L (ref 3.5–5.1)
Sodium: 137 mmol/L (ref 135–145)

## 2020-08-02 LAB — TROPONIN I (HIGH SENSITIVITY)
Troponin I (High Sensitivity): 3 ng/L (ref <18)
Troponin I (High Sensitivity): 4 ng/L (ref <18)

## 2020-08-02 MED ORDER — METHOCARBAMOL 500 MG PO TABS
500.0000 mg | ORAL_TABLET | Freq: Once | ORAL | Status: AC
  Administered 2020-08-02: 500 mg via ORAL
  Filled 2020-08-02: qty 1

## 2020-08-02 MED ORDER — NAPROXEN 500 MG PO TABS
500.0000 mg | ORAL_TABLET | Freq: Two times a day (BID) | ORAL | 0 refills | Status: AC
Start: 2020-08-02 — End: ?

## 2020-08-02 MED ORDER — KETOROLAC TROMETHAMINE 30 MG/ML IJ SOLN
30.0000 mg | Freq: Once | INTRAMUSCULAR | Status: DC
  Filled 2020-08-02: qty 1

## 2020-08-02 MED ORDER — KETOROLAC TROMETHAMINE 30 MG/ML IJ SOLN
30.0000 mg | Freq: Once | INTRAMUSCULAR | Status: AC
  Administered 2020-08-02: 30 mg via INTRAMUSCULAR

## 2020-08-02 MED ORDER — METHOCARBAMOL 500 MG PO TABS: 500.0000 mg | ORAL_TABLET | Freq: Two times a day (BID) | ORAL | 0 refills | Status: AC

## 2020-08-02 NOTE — Discharge Instructions (Addendum)
Take the medications to help with your symptoms. Follow-up with your primary care provider. Return to the ER if you start to experience worsening pain, leg swelling, shortness of breath.

## 2020-08-02 NOTE — ED Triage Notes (Signed)
Central chest pain x 1 day , no radiation , cramp, worse with laying over. Denies shortness of breath , no cough .

## 2020-08-02 NOTE — ED Provider Notes (Signed)
[facility] EMERGENCY DEPARTMENT Provider Note   CSN: [id number] Arrival date & time: 08/02/20  1145     History Chief Complaint  Patient presents with   Chest Pain    Leslie Baldwin is a 60 y.o. female.  The history is provided by the patient.  Chest Pain Pain location:  L chest Pain quality comment:  Cramping Pain radiates to:  Does not radiate Pain severity:  Moderate Onset quality:  Gradual Duration:  2 days Timing:  Constant Progression:  Unchanged Chronicity:  New Context: movement   Relieved by:  None tried Worsened by:  Movement Ineffective treatments:  None tried Associated symptoms: no abdominal pain, no back pain, no cough, no dizziness, no fever, no nausea, no palpitations, no shortness of breath, no vomiting and no weakness   Risk factors: no coronary artery disease and no prior DVT/PE       Past Medical History:  Diagnosis Date   Heart murmur     Patient Active Problem List   Diagnosis Date Noted   Upper abdominal pain 07/12/2019   Screening for cervical cancer 06/06/2019   Stress at home 06/06/2019   Dyspareunia, female 06/06/2019   Abdominal discomfort, generalized 06/06/2019   Blood pressure check 05/13/2019   Fatigue 05/13/2019   Depressed mood 05/13/2019   Encounter for screening for lung cancer 05/13/2019    History reviewed. No pertinent surgical history.   OB History   No obstetric history on file.     Family History  Problem Relation Age of Onset   Healthy Mother    Healthy Father    Diabetes Father    Hypertension Sister    Hypertension Brother    Cancer Maternal Aunt    Colon cancer Neg Hx    Stomach cancer Neg Hx    Rectal cancer Neg Hx    Pancreatic cancer Neg Hx     Social History   Tobacco Use   Smoking status: Every Day    Packs/day: 1.00    Years: 40.00    Pack years: 40.00    Types: Cigarettes   Smokeless tobacco: Never  Vaping Use   Vaping Use: Never used  Substance Use Topics   Alcohol use:  No   Drug use: Never    Home Medications Prior to Admission medications   Medication Sig Start Date End Date Taking? Authorizing Provider  methocarbamol (ROBAXIN) 500 MG tablet Take 1 tablet (500 mg total) by mouth 2 (two) times daily. 08/02/20  Yes Tanashia Ciesla, PA-C  naproxen (NAPROSYN) 500 MG tablet Take 1 tablet (500 mg total) by mouth 2 (two) times daily. 08/02/20  Yes Emmely Bittinger, PA-C  COVID-19 mRNA vaccine, Moderna, 100 MCG/0.5ML injection USE AS DIRECTED 03/11/20 03/11/21  Judyann Munson, MD  tiZANidine (ZANAFLEX) 4 MG tablet Take 1 tablet (4 mg total) by mouth at bedtime. 01/29/20   Wallis Bamberg, PA-C    Allergies    Patient has no known allergies.  Review of Systems   Review of Systems  Constitutional:  Negative for appetite change, chills and fever.  HENT:  Negative for ear pain, rhinorrhea, sneezing and sore throat.   Eyes:  Negative for photophobia and visual disturbance.  Respiratory:  Negative for cough, chest tightness, shortness of breath and wheezing.   Cardiovascular:  Positive for chest pain. Negative for palpitations.  Gastrointestinal:  Negative for abdominal pain, blood in stool, constipation, diarrhea, nausea and vomiting.  Genitourinary:  Negative for dysuria, hematuria and urgency.  Musculoskeletal:  Negative for back pain and myalgias.  Skin:  Negative for rash.  Neurological:  Negative for dizziness, weakness and light-headedness.   Physical Exam Updated Vital Signs BP (!) 163/89   Pulse 65   Temp 98.2 F (36.8 C) (Oral)   Resp (!) 24   Ht 5\' 7"  (1.702 m)   Wt 81.6 kg   SpO2 99%   BMI 28.19 kg/m   Physical Exam Vitals and nursing note reviewed.  Constitutional:      General: She is not in acute distress.    Appearance: She is well-developed.  HENT:     Head: Normocephalic and atraumatic.     Nose: Nose normal.  Eyes:     General: No scleral icterus.       Left eye: No discharge.     Conjunctiva/sclera: Conjunctivae normal.  Cardiovascular:      Rate and Rhythm: Normal rate and regular rhythm.     Heart sounds: Normal heart sounds. No murmur heard.   No friction rub. No gallop.  Pulmonary:     Effort: Pulmonary effort is normal. No respiratory distress.     Breath sounds: Normal breath sounds.  Chest:    Abdominal:     General: Bowel sounds are normal. There is no distension.     Palpations: Abdomen is soft.     Tenderness: There is no abdominal tenderness. There is no guarding.  Musculoskeletal:        General: Normal range of motion.     Cervical back: Normal range of motion and neck supple.     Right lower leg: No edema.     Left lower leg: No edema.     Comments: No lower extremity edema, erythema or calf tenderness bilaterally.  Skin:    General: Skin is warm and dry.     Findings: No rash.  Neurological:     Mental Status: She is alert.     Motor: No abnormal muscle tone.     Coordination: Coordination normal.    ED Results / Procedures / Treatments   Labs (all labs ordered are listed, but only abnormal results are displayed) Labs Reviewed  BASIC METABOLIC PANEL - Abnormal; Notable for the following components:      Result Value   Calcium 8.6 (*)    All other components within normal limits  CBC WITH DIFFERENTIAL/PLATELET  TROPONIN I (HIGH SENSITIVITY)  TROPONIN I (HIGH SENSITIVITY)    EKG EKG Interpretation  Date/Time:  Sunday August 02 2020 11:56:10 EDT Ventricular Rate:  74 PR Interval:  159 QRS Duration: 69 QT Interval:  395 QTC Calculation: 439 R Axis:   66 Text Interpretation: Sinus rhythm Low voltage, precordial leads Probable anteroseptal infarct, old No previous tracing Confirmed by [id number] (Gwyneth Sprout) on 08/02/2020 1:09:29 PM  Radiology DG Chest 2 View  Result Date: 08/02/2020 CLINICAL DATA:  Chest pain. EXAM: CHEST - 2 VIEW COMPARISON:  None. FINDINGS: The heart size and mediastinal contours are within normal limits. Both lungs are clear. The visualized skeletal structures are  unremarkable. IMPRESSION: No active cardiopulmonary disease. Electronically Signed   By: 10/03/2020 M.D.   On: 08/02/2020 12:29    Procedures Procedures   Medications Ordered in ED Medications  ketorolac (TORADOL) 30 MG/ML injection 30 mg (has no administration in time range)  methocarbamol (ROBAXIN) tablet 500 mg (has no administration in time range)    ED Course  I have reviewed the triage vital signs and the nursing notes.  Pertinent  labs & imaging results that were available during my care of the patient were reviewed by me and considered in my medical decision making (see chart for details).  Clinical Course as of 08/02/20 1522  Sun Aug 02, 2020  1311 Troponin I (High Sensitivity): 3 [HK]  1311 Troponin I (High Sensitivity): 4 [HK]    Clinical Course User Index [HK] Dietrich Pates, PA-C   MDM Rules/Calculators/A&P                          60 year old female presenting to the ED with a chief complaint of cramping left-sided chest pain.  Symptoms began yesterday when she was giving her brother a bath.  These have been constant and is worsened when she leans forward.  It is also worse with palpation.  States that this happened to her before "when I had a cold that will have a cold right now."  Has not tried medications to help with symptoms.  No cough, shortness of breath, congestion, fever, leg swelling, history of DVT or PE, MI, recent mobilization, abdominal pain or vomiting.  On exam there is tenderness palpation of the left chest wall area.  No overlying skin changes.  No lower extremity edema, erythema or calf tenderness bilaterally.  2+ DP pulse noted bilaterally.  Oxygen saturations 100% on room air.  Other vital signs within normal limits as well.  Will obtain lab work, EKG and chest x-ray and reassess.  Offered pain medication but patient declines.  EKG shows sinus rhythm, no ischemic changes, no STEMI.  CBC, BMP unremarkable.  Chest x-ray is unremarkable.  Troponin is  negative x2.  She is low risk by heart score.  She is low risk by Wells criteria so I doubt PE as a cause of her symptoms as she is not hypoxic, tachycardic.  I suspect that her symptoms are musculoskeletal in nature.  Low suspicion for dissection and no structural cause seen on imaging.  We will have her take Tylenol and ibuprofen to help with this muscle pain.  Return precautions given.   Patient is hemodynamically stable, in NAD, and able to ambulate in the ED. Evaluation does not show pathology that would require ongoing emergent intervention or inpatient treatment. I explained the diagnosis to the patient. Pain has been managed and has no complaints prior to discharge. Patient is comfortable with above plan and is stable for discharge at this time. All questions were answered prior to disposition. Strict return precautions for returning to the ED were discussed. Encouraged follow up with PCP.   An After Visit Summary was printed and given to the patient.   Portions of this note were generated with Scientist, clinical (histocompatibility and immunogenetics). Dictation errors may occur despite best attempts at proofreading.  Final Clinical Impression(s) / ED Diagnoses Final diagnoses:  Chest wall pain    Rx / DC Orders ED Discharge Orders          Ordered    naproxen (NAPROSYN) 500 MG tablet  2 times daily        08/02/20 1522    methocarbamol (ROBAXIN) 500 MG tablet  2 times daily        08/02/20 1522             Dietrich Pates, PA-C 08/02/20 1522    Gwyneth Sprout, MD 08/05/20 2217

## 2020-10-12 IMAGING — US US ABDOMEN COMPLETE
1 series · 14 of 25 positions shown · non-contrast
Comparison: None.

CLINICAL DATA: Epigastric and right upper quadrant abdominal pain.

EXAM:
ABDOMEN ULTRASOUND COMPLETE

[Series 1: us abdomen complete · 14 of 109 slices shown]
[im 1/109]
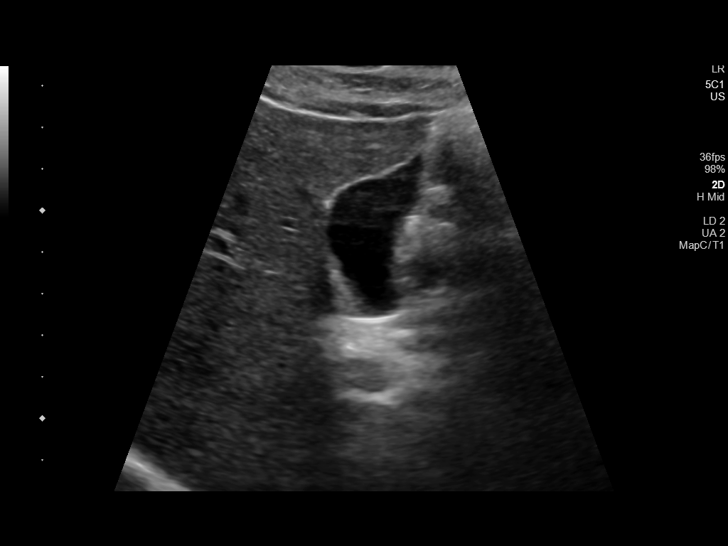
[im 10/109]
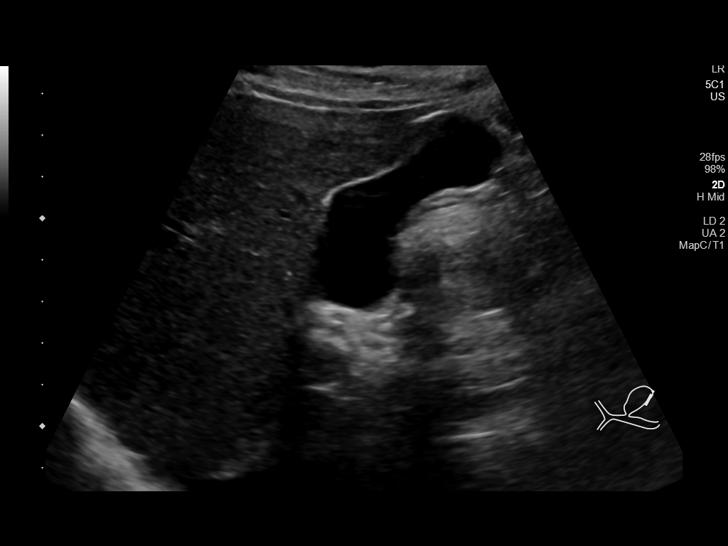
[im 19/109]
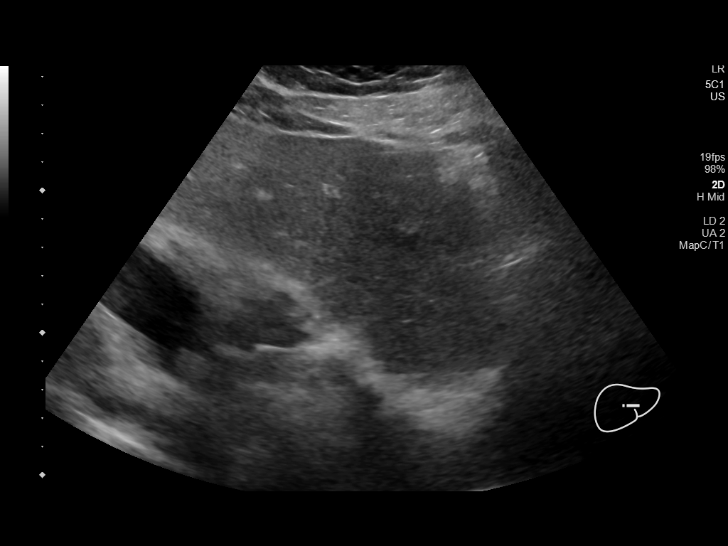
[im 28/109]
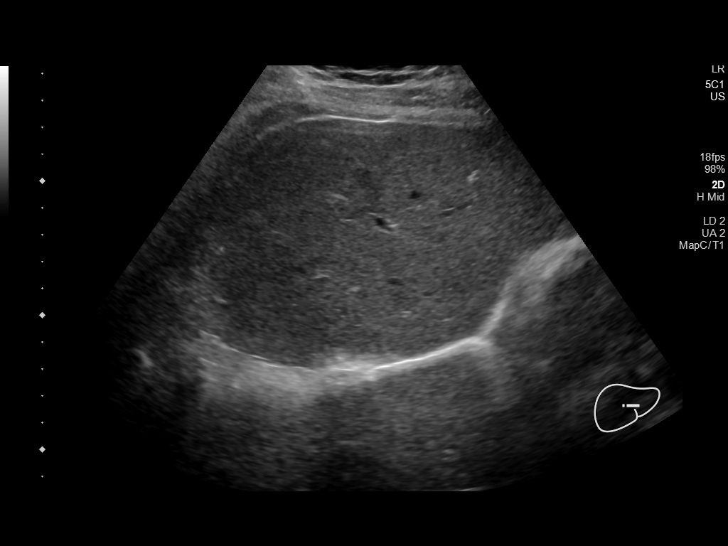
[im 37/109]
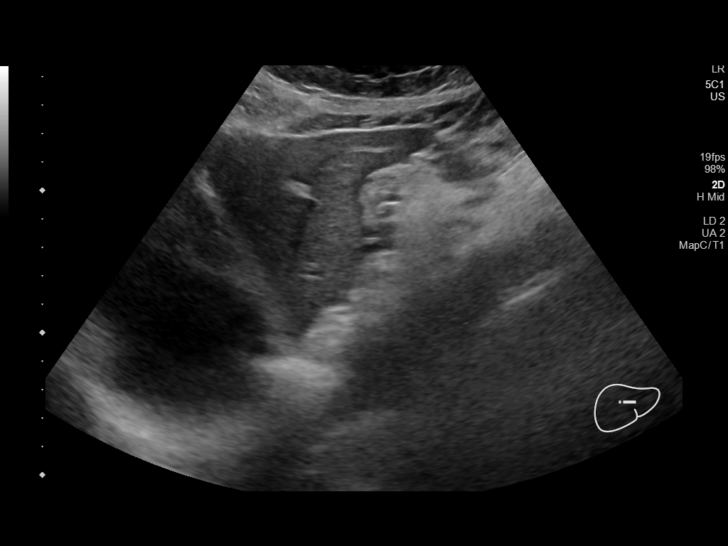
[im 41/109]
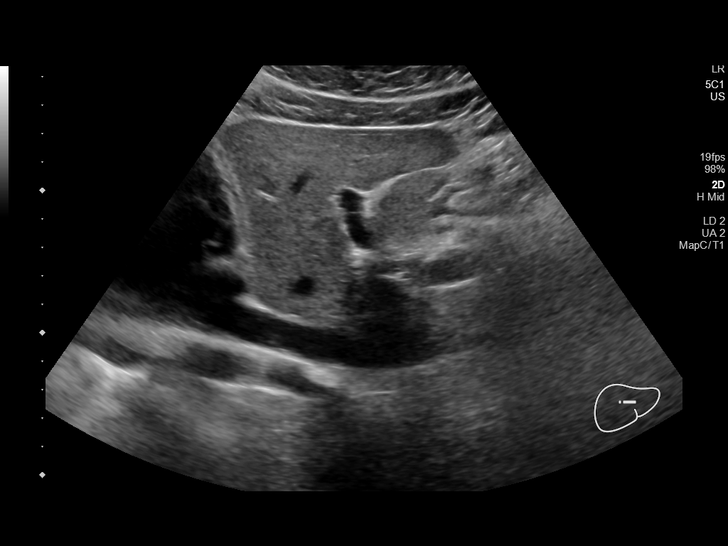
[im 50/109]
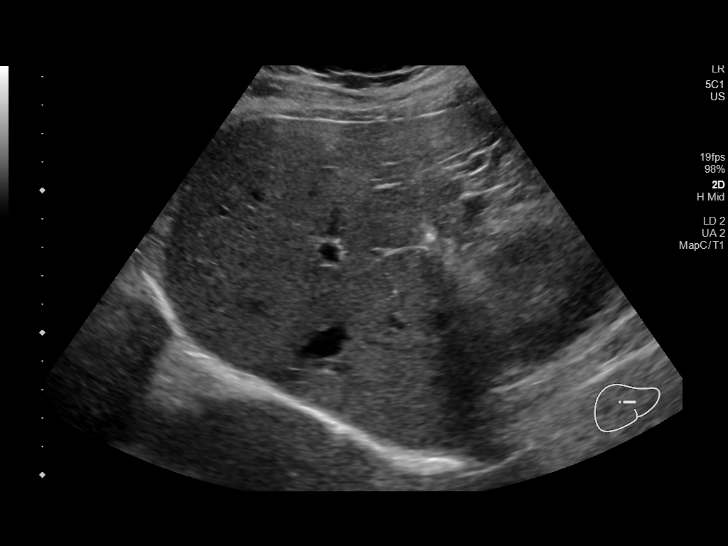
[im 59/109]
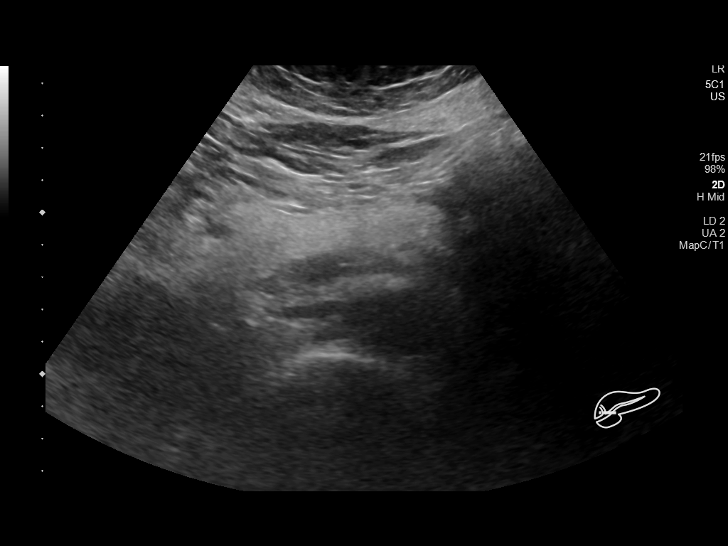
[im 68/109]
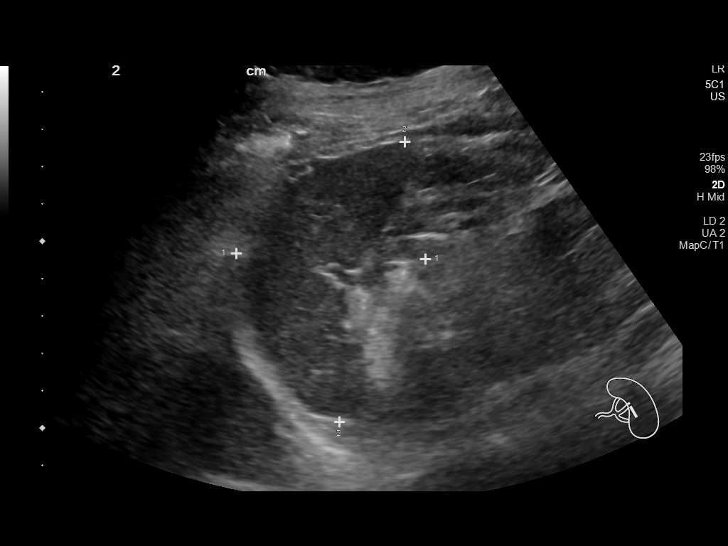
[im 73/109]
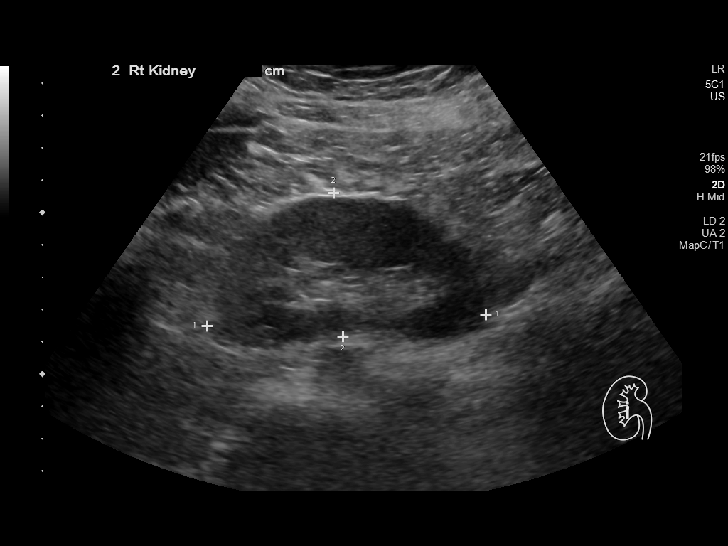
[im 82/109]
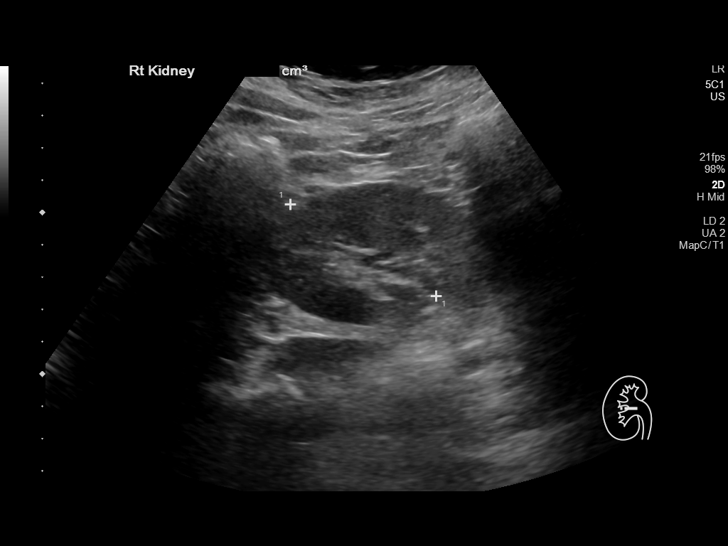
[im 91/109]
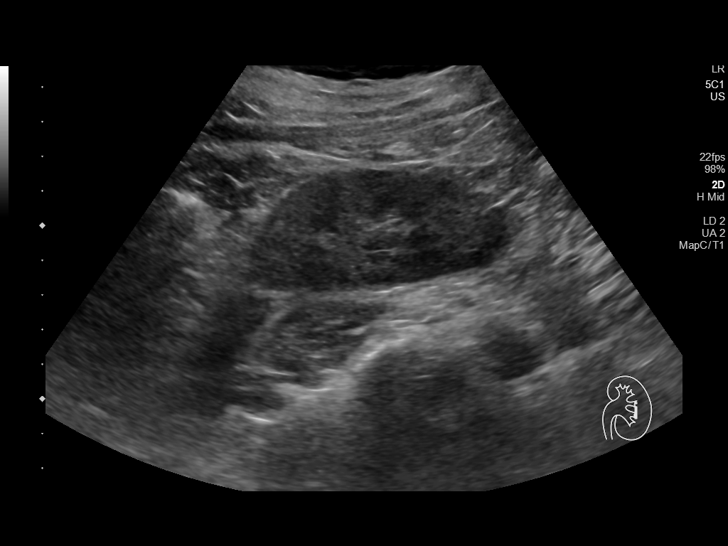
[im 100/109]
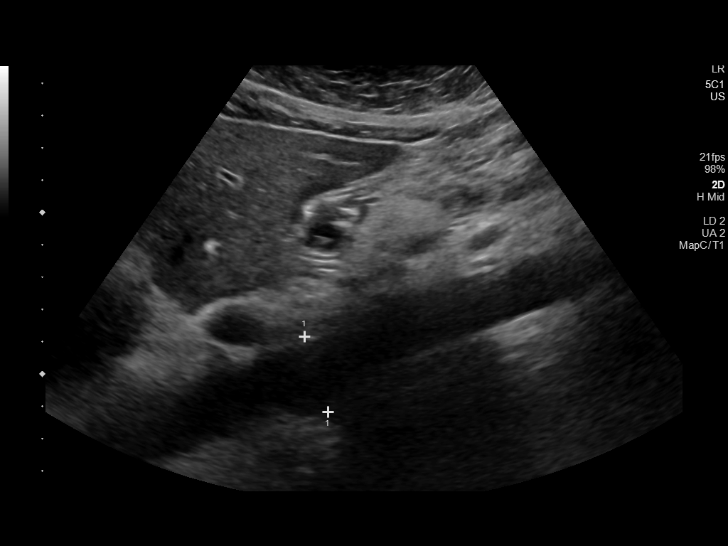
[im 109/109]
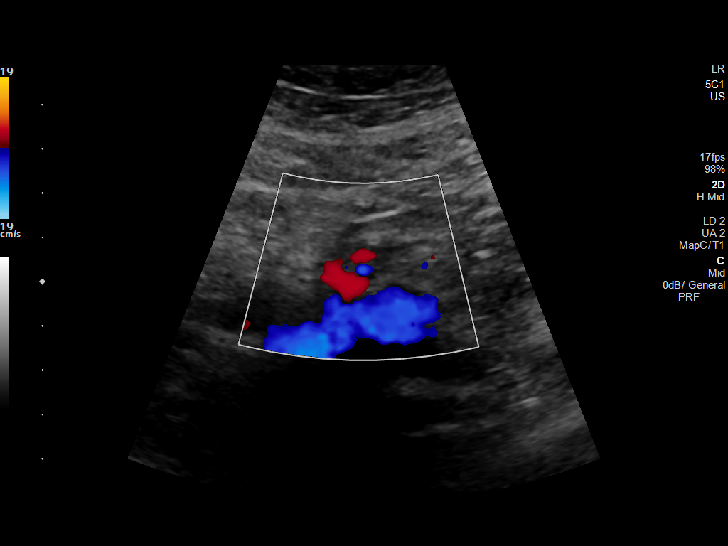

[14 of 25 positions shown; findings below may reference images not displayed]

FINDINGS: Gallbladder: No gallstones or wall thickening visualized. No
sonographic Murphy sign noted by sonographer.

Common bile duct: Diameter: 3 mm, within normal limits.

Liver: No focal lesion identified. Within normal limits in
parenchymal echogenicity. Portal vein is patent on color Doppler
imaging with normal direction of blood flow towards the liver.

IVC: No abnormality visualized.

Pancreas: Visualized portion unremarkable.

Spleen: Size and appearance within normal limits.

Right Kidney: Length: 9.8 cm. Echogenicity within normal limits. No
mass or hydronephrosis visualized.

Left Kidney: Length: 10.5 cm. Echogenicity within normal limits. No
mass or hydronephrosis visualized.

Abdominal aorta: No aneurysm visualized.

Other findings: None.
IMPRESSION: Normal study.

## 2021-03-23 ENCOUNTER — Encounter: Payer: Self-pay | Admitting: Internal Medicine

## 2021-04-07 DIAGNOSIS — Z86010 Personal history of colonic polyps: Secondary | ICD-10-CM | POA: Diagnosis not present

## 2021-04-07 DIAGNOSIS — Z79899 Other long term (current) drug therapy: Secondary | ICD-10-CM | POA: Diagnosis not present

## 2021-04-23 DIAGNOSIS — Z86010 Personal history of colonic polyps: Secondary | ICD-10-CM | POA: Diagnosis not present

## 2021-04-23 DIAGNOSIS — Z1211 Encounter for screening for malignant neoplasm of colon: Secondary | ICD-10-CM | POA: Diagnosis not present

## 2021-05-02 DIAGNOSIS — K635 Polyp of colon: Secondary | ICD-10-CM | POA: Diagnosis not present

## 2021-05-22 DIAGNOSIS — D126 Benign neoplasm of colon, unspecified: Secondary | ICD-10-CM | POA: Diagnosis not present

## 2021-06-07 DIAGNOSIS — Z0000 Encounter for general adult medical examination without abnormal findings: Secondary | ICD-10-CM | POA: Diagnosis not present

## 2021-06-07 DIAGNOSIS — Z13220 Encounter for screening for lipoid disorders: Secondary | ICD-10-CM | POA: Diagnosis not present

## 2021-06-07 DIAGNOSIS — Z1329 Encounter for screening for other suspected endocrine disorder: Secondary | ICD-10-CM | POA: Diagnosis not present

## 2021-06-07 DIAGNOSIS — Z130 Encounter for screening for diseases of the blood and blood-forming organs and certain disorders involving the immune mechanism: Secondary | ICD-10-CM | POA: Diagnosis not present

## 2021-06-07 DIAGNOSIS — F17210 Nicotine dependence, cigarettes, uncomplicated: Secondary | ICD-10-CM | POA: Diagnosis not present

## 2021-06-07 DIAGNOSIS — Z122 Encounter for screening for malignant neoplasm of respiratory organs: Secondary | ICD-10-CM | POA: Diagnosis not present

## 2021-08-09 DIAGNOSIS — Z1231 Encounter for screening mammogram for malignant neoplasm of breast: Secondary | ICD-10-CM | POA: Diagnosis not present

## 2021-08-09 DIAGNOSIS — Z6831 Body mass index (BMI) 31.0-31.9, adult: Secondary | ICD-10-CM | POA: Diagnosis not present

## 2021-08-09 DIAGNOSIS — Z124 Encounter for screening for malignant neoplasm of cervix: Secondary | ICD-10-CM | POA: Diagnosis not present

## 2021-08-09 DIAGNOSIS — Z01419 Encounter for gynecological examination (general) (routine) without abnormal findings: Secondary | ICD-10-CM | POA: Diagnosis not present

## 2021-08-09 DIAGNOSIS — Z13820 Encounter for screening for osteoporosis: Secondary | ICD-10-CM | POA: Diagnosis not present

## 2021-10-22 IMAGING — CR DG CHEST 2V
2 series · 2 of 2 positions shown · non-contrast
Comparison: None.

CLINICAL DATA: Chest pain.

EXAM:
CHEST - 2 VIEW

[w chest pa]
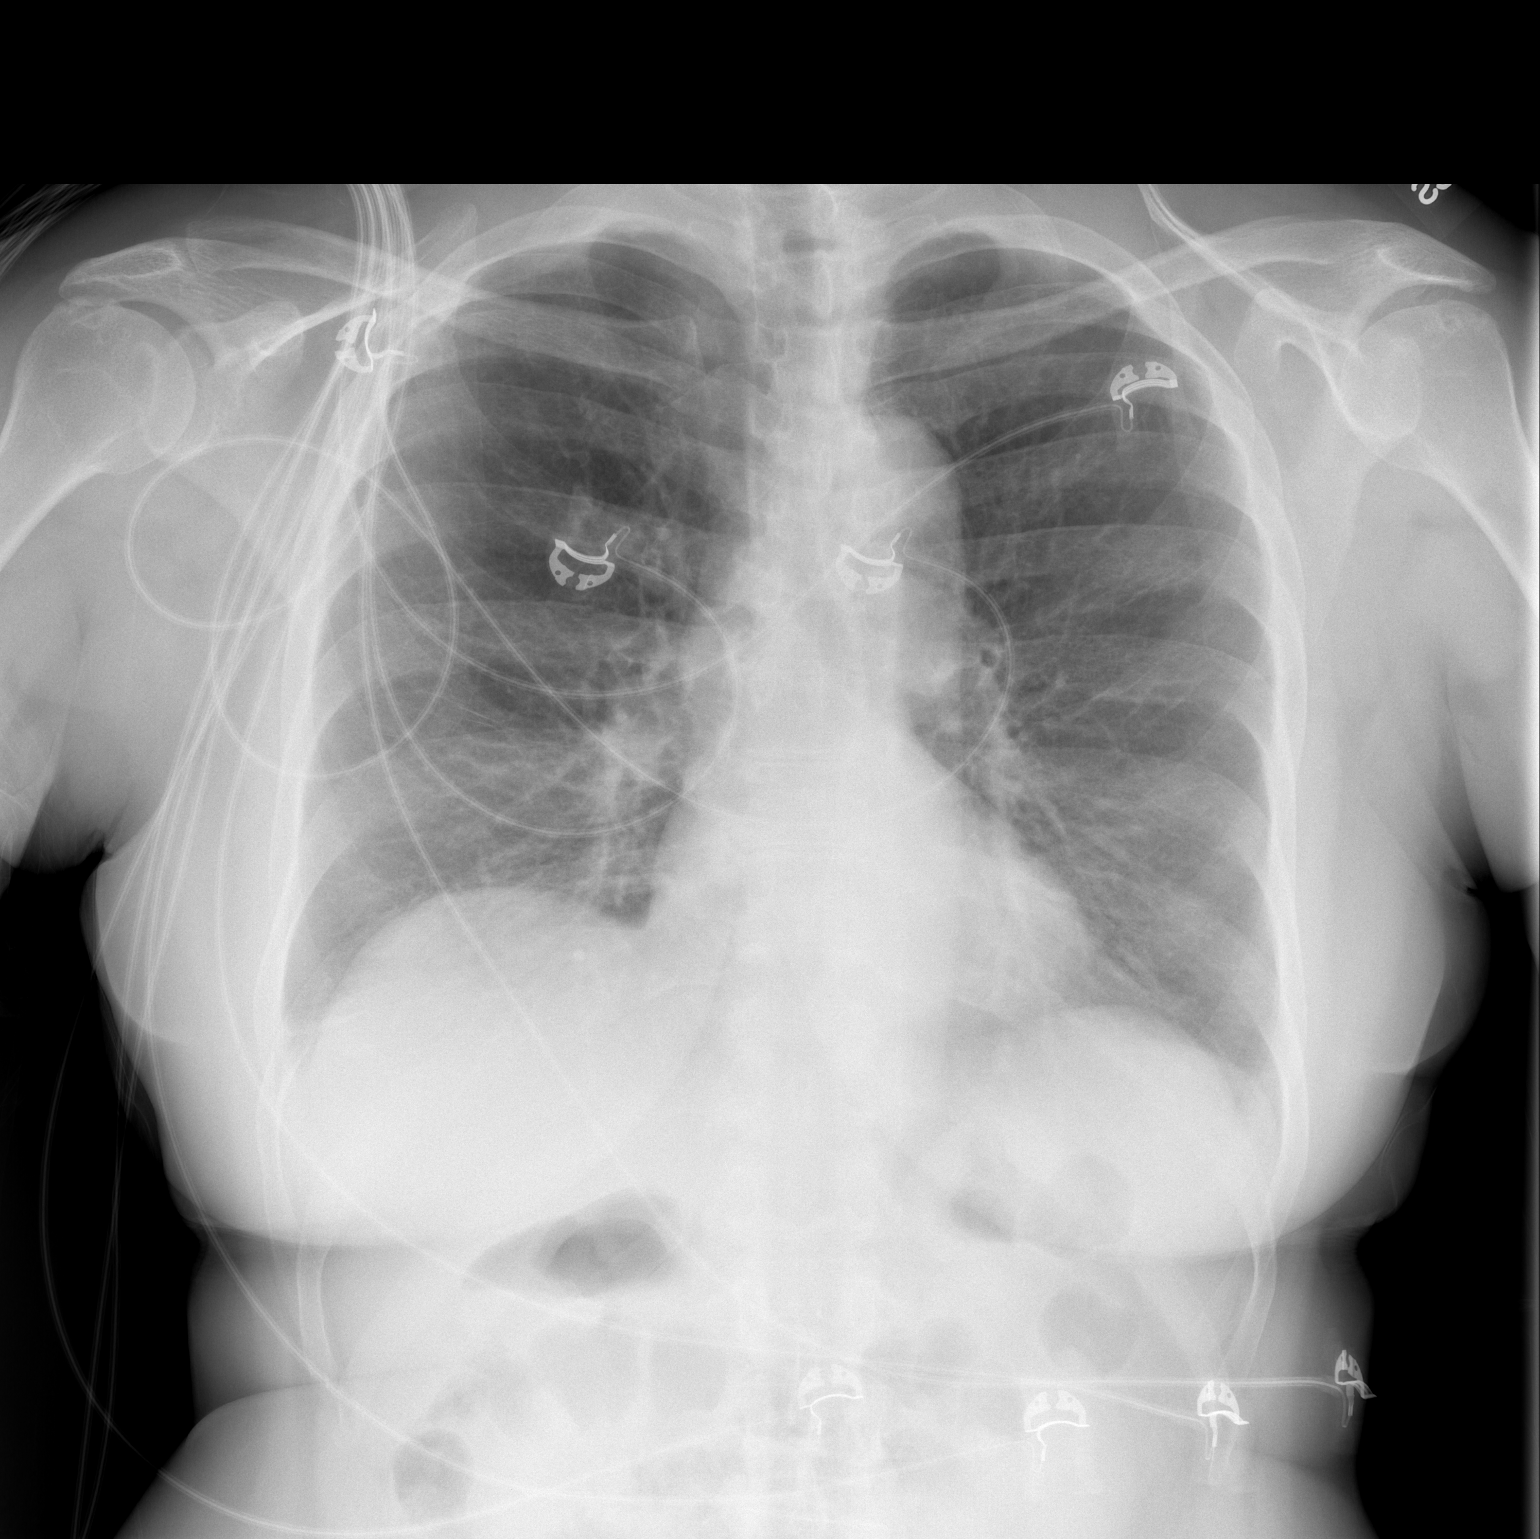

[w chest lat]
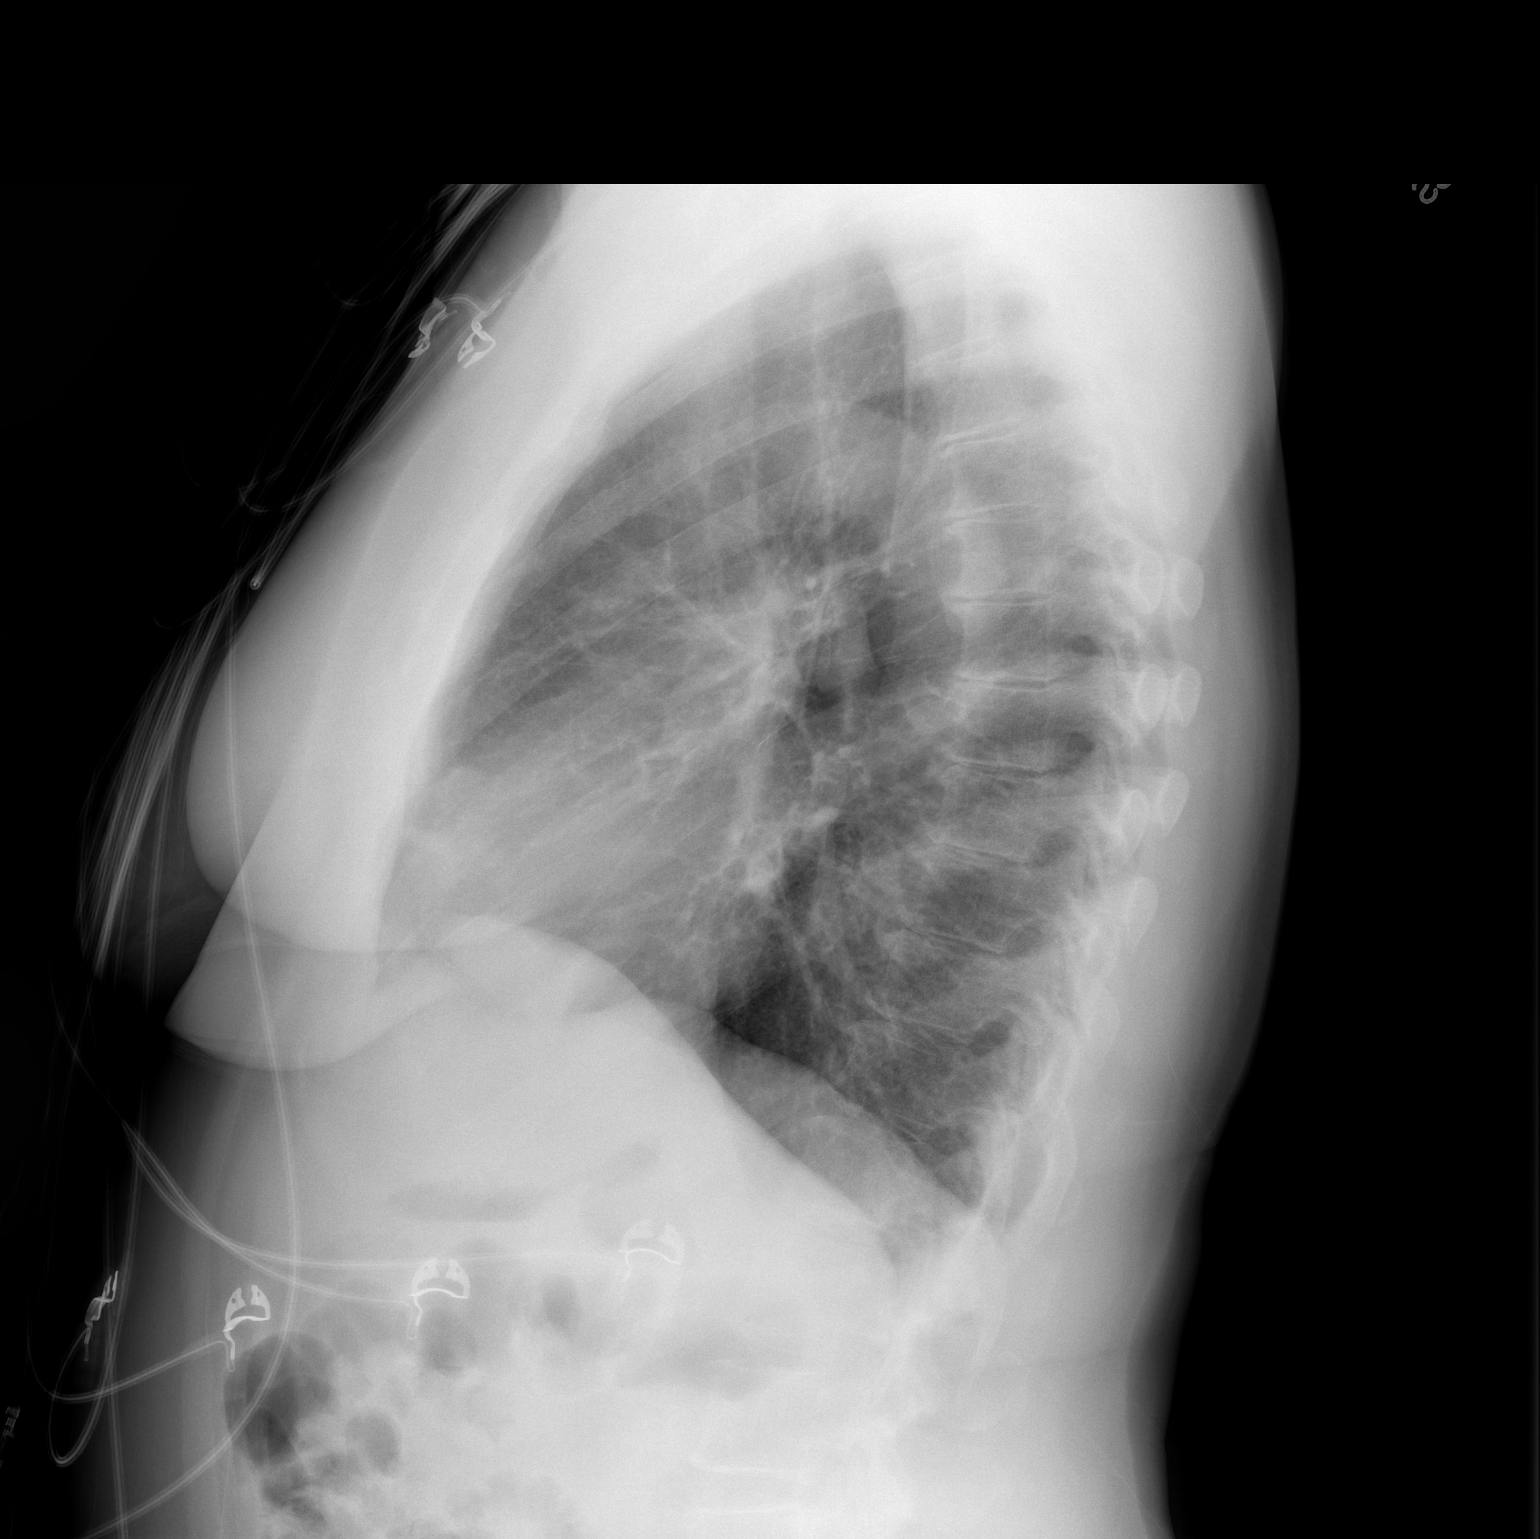

[2 of 2 positions shown; findings below may reference images not displayed]

FINDINGS: The heart size and mediastinal contours are within normal limits.
Both lungs are clear. The visualized skeletal structures are
unremarkable.
IMPRESSION: No active cardiopulmonary disease.
